# Patient Record
Sex: Male | Born: 2000 | Race: White | Hispanic: No | Marital: Single | Smoking: Never smoker
Health system: Southern US, Community
[De-identification: ages and names within clinical notes are randomized; demographics above are authoritative.]

## PROBLEM LIST (undated history)

## (undated) DIAGNOSIS — F980 Enuresis not due to a substance or known physiological condition: Secondary | ICD-10-CM

## (undated) DIAGNOSIS — Z789 Other specified health status: Secondary | ICD-10-CM

## (undated) HISTORY — PX: TYMPANOSTOMY TUBE PLACEMENT: SHX32

## (undated) HISTORY — DX: Other specified health status: Z78.9

## (undated) HISTORY — DX: Enuresis not due to a substance or known physiological condition: F98.0

---
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
## 2019-04-04 ENCOUNTER — Encounter (HOSPITAL_BASED_OUTPATIENT_CLINIC_OR_DEPARTMENT_OTHER): Payer: Self-pay | Admitting: *Deleted

## 2019-04-04 ENCOUNTER — Emergency Department (HOSPITAL_BASED_OUTPATIENT_CLINIC_OR_DEPARTMENT_OTHER)
Admission: EM | Admit: 2019-04-04 | Discharge: 2019-04-04 | Disposition: A | Discharge status: Home / Self Care | Payer: 59 | Attending: Emergency Medicine | Admitting: Emergency Medicine

## 2019-04-04 ENCOUNTER — Other Ambulatory Visit: Payer: Self-pay

## 2019-04-04 DIAGNOSIS — Y939 Activity, unspecified: Secondary | ICD-10-CM | POA: Diagnosis not present

## 2019-04-04 DIAGNOSIS — S0101XA Laceration without foreign body of scalp, initial encounter: Secondary | ICD-10-CM

## 2019-04-04 DIAGNOSIS — Y999 Unspecified external cause status: Secondary | ICD-10-CM | POA: Insufficient documentation

## 2019-04-04 DIAGNOSIS — W228XXA Striking against or struck by other objects, initial encounter: Secondary | ICD-10-CM | POA: Diagnosis not present

## 2019-04-04 DIAGNOSIS — Y929 Unspecified place or not applicable: Secondary | ICD-10-CM | POA: Insufficient documentation

## 2019-04-04 MED ORDER — LIDOCAINE HCL (PF) 1 % IJ SOLN
5.0000 mL | Freq: Once | INTRAMUSCULAR | Status: AC
  Administered 2019-04-04: 5 mL
  Filled 2019-04-04: qty 5

## 2019-04-04 NOTE — ED Triage Notes (Signed)
Pt c/o head injury with lac x 30 mins ago

## 2019-04-04 NOTE — ED Notes (Signed)
Dressing to head laceration

## 2019-04-04 NOTE — Discharge Instructions (Signed)
Return in 8 days for staple removal 

## 2019-04-05 NOTE — ED Provider Notes (Signed)
[facility] EMERGENCY DEPARTMENT Provider Note   CSN: [id number] Arrival date & time: 04/04/19  1624     History Chief Complaint  Patient presents with  . Head Injury    Carl Myers is a 18 y.o. male.  The history is provided by the patient. No language interpreter was used.  Head Injury Location:  Frontal Mechanism of injury: direct blow   Pain details:    Quality:  Aching   Severity:  Mild   Timing:  Constant   Progression:  Worsening Chronicity:  New Worsened by:  Nothing Ineffective treatments:  None tried Associated symptoms: no loss of consciousness and no memory loss    Pt complains of a cut to his forehead. No loss of conciousness    Past Medical History:  Diagnosis Date  . Nonorganic enuresis     Patient Active Problem List   Diagnosis Date Noted  . Well child check 03/10/2015  . BMI (body mass index), pediatric, 5% to less than 85% for age 17/08/2014  . 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 BMI (body mass index), pediatric, less than 5th percentile for age 20/17/2013  . Enuresis, nocturnal only 10/20/2011    Past Surgical History:  Procedure Laterality Date  . TYMPANOSTOMY TUBE PLACEMENT         Family History  Problem Relation Age of Onset  . Cancer Maternal Grandfather   . Cancer Paternal Grandfather   . Thyroid disease Paternal Grandmother        Aneurysm, renal stones  . Alcohol abuse Neg Hx   . Arthritis Neg Hx   . Asthma Neg Hx   . Birth defects Neg Hx   . COPD Neg Hx   . Depression Neg Hx   . Diabetes Neg Hx   . Drug abuse Neg Hx   . Early death Neg Hx   . Hearing loss Neg Hx   . Heart disease Neg Hx   . Hyperlipidemia Neg Hx   . Hypertension Neg Hx   . Kidney disease Neg Hx   . Learning disabilities Neg Hx   . Mental illness Neg Hx   . Mental retardation Neg Hx   . Miscarriages / Stillbirths Neg Hx   . Stroke Neg Hx   . Vision loss Neg Hx   . Varicose Veins Neg Hx     Social History   Tobacco Use    . Smoking status: Never Smoker  . Smokeless tobacco: Never Used  Substance Use Topics  . Alcohol use: No  . Drug use: No    Home Medications Prior to Admission medications   Not on File    Allergies    Patient has no known allergies.  Review of Systems   Review of Systems  Neurological: Negative for loss of consciousness.  Psychiatric/Behavioral: Negative for memory loss.  All other systems reviewed and are negative.   Physical Exam Updated Vital Signs BP 132/78 (BP Location: Right Arm)   Pulse 80   Temp 98.2 F (36.8 C) (Oral)   Resp 16   Ht 6\' 1"  (1.854 m)   Wt 63.5 kg   SpO2 99%   BMI 18.47 kg/m   Physical Exam Vitals and nursing note reviewed.  Constitutional:      Appearance: He is well-developed.  HENT:     Head: Normocephalic.     Comments: 1.5 cm laceration scalp Pulmonary:     Effort: Pulmonary effort is normal.  Abdominal:     General: There is  no distension.  Musculoskeletal:        General: Normal range of motion.     Cervical back: Normal range of motion.  Skin:    General: Skin is warm.  Neurological:     Mental Status: He is alert and oriented to person, place, and time.     ED Results / Procedures / Treatments   Labs (all labs ordered are listed, but only abnormal results are displayed) Labs Reviewed - No data to display  EKG None  Radiology No results found.  Procedures .Marland KitchenLaceration Repair  Date/Time: 04/05/2019 8:41 AM Performed by: Elson Areas, PA-C Authorized by: Elson Areas, PA-C   Consent:    Consent obtained:  Verbal   Consent given by:  Patient   Risks discussed:  Infection   Alternatives discussed:  No treatment Anesthesia (see MAR for exact dosages):    Anesthesia method:  Local infiltration Laceration details:    Location:  Scalp   Length (cm):  1.5   Depth (mm):  3 Repair type:    Repair type:  Simple Pre-procedure details:    Preparation:  Patient was prepped and draped in usual sterile  fashion Exploration:    Contaminated: no   Treatment:    Area cleansed with:  Betadine   Amount of cleaning:  Standard Skin repair:    Repair method:  Staples   Number of staples:  2 Post-procedure details:    Patient tolerance of procedure:  Tolerated with difficulty   (including critical care time)  Medications Ordered in ED Medications  lidocaine (PF) (XYLOCAINE) 1 % injection 5 mL (5 mLs Infiltration Given by Other 04/04/19 1926)    ED Course  I have reviewed the triage vital signs and the nursing notes.  Pertinent labs & imaging results that were available during my care of the patient were reviewed by me and considered in my medical decision making (see chart for details).    MDM Rules/Calculators/A&P                      MDM  Pt counseled on staples.  Staple removal in 8 days Final Clinical Impression(s) / ED Diagnoses Final diagnoses:  Laceration of scalp without foreign body, initial encounter    Rx / DC Orders ED Discharge Orders    None      Discharge Instructions     Return in 8 days for staple removal       Elson Areas, [place] 04/05/19 [id number]    Rolan Bucco, MD 04/05/19 1504

## 2019-04-12 ENCOUNTER — Ambulatory Visit (INDEPENDENT_AMBULATORY_CARE_PROVIDER_SITE_OTHER): Payer: 59 | Admitting: Pediatrics

## 2019-04-12 ENCOUNTER — Other Ambulatory Visit: Payer: Self-pay

## 2019-04-12 ENCOUNTER — Encounter: Payer: Self-pay | Admitting: Pediatrics

## 2019-04-12 VITALS — Wt 137.3 lb

## 2019-04-12 DIAGNOSIS — Z4802 Encounter for removal of sutures: Secondary | ICD-10-CM

## 2019-04-12 NOTE — Progress Notes (Signed)
Subjective:    Carl Myers is a 19 y.o. male who obtained a laceration 7 days ago, which required closure with 2 staples. Mechanism of injury: hit head on wooden board. He denies pain, redness, or drainage from the wound. His last tetanus was 8 years ago.  The following portions of the patient's history were reviewed and updated as appropriate: allergies, current medications, past family history, past medical history, past social history, past surgical history and problem list.  Review of Systems Pertinent items are noted in HPI.    Objective:    Wt 137 lb 4.8 oz (62.3 kg)   BMI 18.11 kg/m  Injury exam:  A 1.5 cm laceration noted on the scalp is healing well, without evidence of infection.    Assessment:    Laceration is healing well, without evidence of infection.    Plan:     1. 2 staples were removed. 2. Wound care discussed. 3. Follow up as needed.

## 2020-06-10 ENCOUNTER — Telehealth: Payer: Self-pay

## 2020-06-10 DIAGNOSIS — K5904 Chronic idiopathic constipation: Secondary | ICD-10-CM

## 2020-06-10 NOTE — Telephone Encounter (Signed)
Spoke to mom and ordered abdominal X ray for possible constipation

## 2020-06-10 NOTE — Telephone Encounter (Signed)
Called to schedule last WCC (per Crystal & Dr. Ardyth Man). After looking at a few dates she decided to check with her son first to make sure he will be available (will call back later to schedule). She then asked to speak with Dr. Ardyth Man about a concern she has regarding Laderius's stomach problems. She was reluctant to give much information over the phone, but eventually said that it has something to do with discomfort & pain. Asked for a call back at some point today.  [phone]

## 2020-06-11 ENCOUNTER — Other Ambulatory Visit: Payer: Self-pay

## 2020-06-11 ENCOUNTER — Ambulatory Visit
Admission: RE | Admit: 2020-06-11 | Discharge: 2020-06-11 | Disposition: A | Discharge status: Home / Self Care | Payer: 59 | Source: Ambulatory Visit | Attending: Pediatrics | Admitting: Pediatrics

## 2020-06-11 DIAGNOSIS — K5904 Chronic idiopathic constipation: Secondary | ICD-10-CM

## 2020-06-12 ENCOUNTER — Telehealth: Payer: Self-pay

## 2020-06-12 NOTE — Telephone Encounter (Signed)
Mother would like results of X Ray done yesterday

## 2020-06-13 NOTE — Telephone Encounter (Signed)
Discussed results with dad --mom was not able to talk

## 2021-03-13 ENCOUNTER — Encounter: Payer: Self-pay | Admitting: Gastroenterology

## 2021-04-17 ENCOUNTER — Encounter: Payer: Self-pay | Admitting: Gastroenterology

## 2021-04-17 ENCOUNTER — Other Ambulatory Visit: Payer: Self-pay

## 2021-04-17 ENCOUNTER — Ambulatory Visit (INDEPENDENT_AMBULATORY_CARE_PROVIDER_SITE_OTHER): Payer: 59 | Admitting: Gastroenterology

## 2021-04-17 VITALS — BP 118/78 | HR 80 | Ht 73.0 in | Wt 141.4 lb

## 2021-04-17 DIAGNOSIS — K581 Irritable bowel syndrome with constipation: Secondary | ICD-10-CM | POA: Diagnosis not present

## 2021-04-17 NOTE — Patient Instructions (Addendum)
If you are age 22 or older, your body mass index should be between 23-30. Your Body mass index is 18.65 kg/m. If this is out of the aforementioned range listed, please consider follow up with your Primary Care Provider.  If you are age 49 or younger, your body mass index should be between 19-25. Your Body mass index is 18.65 kg/m. If this is out of the aformentioned range listed, please consider follow up with your Primary Care Provider.   ________________________________________________________  The [facility] providers would like to encourage you to use [facility] to communicate with providers for non-urgent requests or questions.  Due to long hold times on the telephone, sending your provider a message by [facility] may be a faster and more efficient way to get a response.  Please allow 48 business hours for a response.  Please remember that this is for non-urgent requests.  _______________________________________________________  Increase water intake.   Call with any questions or concerns.  Thank you,  Dr. Jackquline Denmark

## 2021-04-17 NOTE — Progress Notes (Signed)
Chief Complaint: For constipation  Referring Provider:  Marcha Solders, MD      ASSESSMENT AND PLAN;   #1. IBS-C-doing much better with dietary changes  Plan: -Increase water intake -Continue Apple/prune juice/oranges -No need for meds or any GI intervention currently.  No red flag symptoms -He will call us if he starts having any problems in future.  HPI:    Carl Myers is a 21 y.o. male  Known to me C/O constipation with pellet-like stools, abdominal bloating x intermittently ever since early teens.  Also had an episode as a child.  Got worse in March 2022-x-ray KUB showed moderate stool throughout the colon.  He was advised to start prune juice, fruits like apples, oranges and increased water intake with resultant significant improvement.  Is currently having 1 BM every day.  Abdominal distention is much better.  No abdominal pain.  No diarrhea.  No upper GI symptoms.  No weight loss.  No melena or hematochezia.  Denies having any history suggestive of gluten intolerance  No excessive sodas, chocolates, chewing gums, artificial sweeteners and candy. No NSAIDs  No family history of colon cancer.    Past Medical History:  Diagnosis Date   Nonorganic enuresis     Past Surgical History:  Procedure Laterality Date   TYMPANOSTOMY TUBE PLACEMENT      Family History  Problem Relation Age of Onset   Cancer Maternal Grandfather    Cancer Paternal Grandfather    Thyroid disease Paternal Grandmother        Aneurysm, renal stones   Alcohol abuse Neg Hx    Arthritis Neg Hx    Asthma Neg Hx    Birth defects Neg Hx    COPD Neg Hx    Depression Neg Hx    Diabetes Neg Hx    Drug abuse Neg Hx    Early death Neg Hx    Hearing loss Neg Hx    Heart disease Neg Hx    Hyperlipidemia Neg Hx    Hypertension Neg Hx    Kidney disease Neg Hx    Learning disabilities Neg Hx    Mental illness Neg Hx    Mental retardation Neg Hx    Miscarriages / Stillbirths Neg  Hx    Stroke Neg Hx    Vision loss Neg Hx    Varicose Veins Neg Hx     Social History   Tobacco Use   Smoking status: Never   Smokeless tobacco: Never  Vaping Use   Vaping Use: Never used  Substance Use Topics   Alcohol use: No   Drug use: No    No current outpatient medications on file.   No current facility-administered medications for this visit.    No Known Allergies  Review of Systems:  Constitutional: Denies fever, chills, diaphoresis, appetite change and fatigue.  HEENT: Denies photophobia, eye pain, redness, hearing loss, ear pain, congestion, sore throat, rhinorrhea, sneezing, mouth sores, neck pain, neck stiffness and tinnitus.   Respiratory: Denies SOB, DOE, cough, chest tightness,  and wheezing.   Cardiovascular: Denies chest pain, palpitations and leg swelling.  Genitourinary: Denies dysuria, urgency, frequency, hematuria, flank pain and difficulty urinating.  Musculoskeletal: Denies myalgias, back pain, joint swelling, arthralgias and gait problem.  Skin: No rash.  Neurological: Denies dizziness, seizures, syncope, weakness, light-headedness, numbness and headaches.  Hematological: Denies adenopathy. Easy bruising, personal or family bleeding history  Psychiatric/Behavioral: No anxiety or depression     Physical Exam:  BP 118/78    Pulse 80    Ht 6\' 1"  (1.854 m)    Wt 141 lb 6 oz (64.1 kg)    SpO2 99%    BMI 18.65 kg/m  Wt Readings from Last 3 Encounters:  04/17/21 141 lb 6 oz (64.1 kg)  04/12/19 137 lb 4.8 oz (62.3 kg) (22 %, Z= -0.76)*  04/04/19 140 lb (63.5 kg) (27 %, Z= -0.63)*   * Growth percentiles are based on CDC (Boys, 2-20 Years) data.   Constitutional:  Well-developed, in no acute distress. Psychiatric: Normal mood and affect. Behavior is normal. HEENT: Conjunctivae are normal. No scleral icterus. Cardiovascular: Normal rate, regular rhythm. No edema Pulmonary/chest: Effort normal and breath sounds normal. No wheezing, rales or  rhonchi. Abdominal: Soft, nondistended. Nontender. Bowel sounds active throughout. There are no masses palpable. No hepatomegaly. Rectal: Deferred Neurological: Alert and oriented to person place and time. Skin: Skin is warm and dry. No rashes noted.    Carmell Austria, MD 04/17/2021, 11:17 AM  Cc: Marcha Solders, MD

## 2022-07-22 IMAGING — CR DG ABDOMEN 1V
1 series · 1 of 1 positions shown · non-contrast
Comparison: None.

CLINICAL DATA: Constipation.  Epigastric pain for 1.5 months.

EXAM:
ABDOMEN - 1 VIEW

[t abdomen supine]
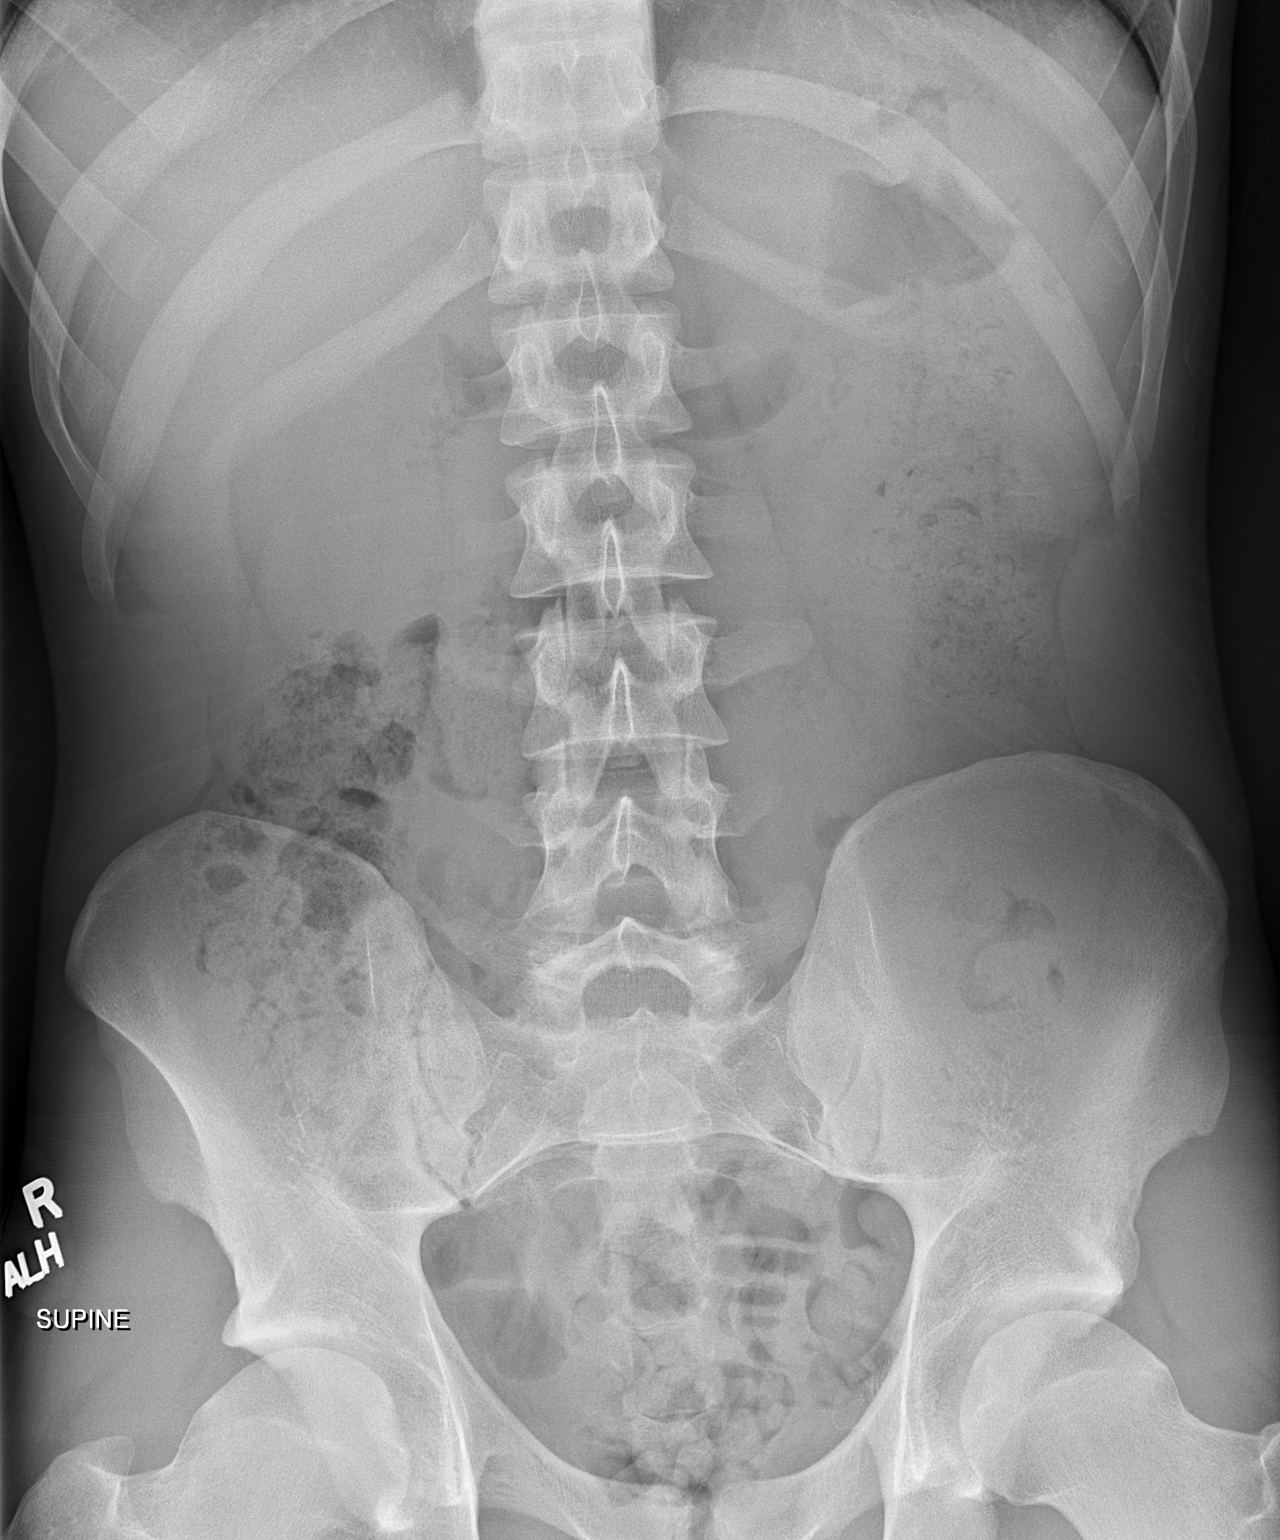

[1 of 1 positions shown; findings below may reference images not displayed]

FINDINGS: There is a moderate amount of stool throughout the colon and rectum.
No dilated loops of bowel are seen to suggest obstruction. No
abnormal calcification is evident. No acute osseous abnormality is
seen.
IMPRESSION: Moderate colonic stool burden.

## 2023-10-18 ENCOUNTER — Ambulatory Visit: Payer: Self-pay | Admitting: Physician Assistant

## 2023-10-18 ENCOUNTER — Encounter: Payer: Self-pay | Admitting: Physician Assistant

## 2023-10-18 ENCOUNTER — Ambulatory Visit: Payer: Self-pay

## 2023-10-18 VITALS — BP 112/73 | HR 85 | Ht 74.0 in | Wt 142.0 lb

## 2023-10-18 DIAGNOSIS — K625 Hemorrhage of anus and rectum: Secondary | ICD-10-CM

## 2023-10-18 NOTE — Telephone Encounter (Signed)
 FYI Only or Action Required?: FYI only for provider.  Patient was last seen in primary care on n/a.  Called Nurse Triage reporting Rectal Bleeding.  Symptoms began today.  Interventions attempted: Nothing.  Symptoms are: stable.  Triage Disposition: See PCP When Office is Open (Within 3 Days)  Patient/caregiver understands and will follow disposition?: to [employer] today. NP appt made for Dr Frann in August     Copied from CRM #[id number]. Topic: Clinical - Red Word Triage >> Oct 18, 2023  3:43 PM Rosina D wrote: Reason for RMF:[id number] when he has a bowel movement and the bleeding is in his shorts. It just started today Reason for Disposition  MILD rectal bleeding (e.g., more than just a few drops or streaks)  Answer Assessment - Initial Assessment Questions 1. APPEARANCE of BLOOD: What color is it? Is it passed separately, on the surface of the stool, or mixed in with the stool?      Dark red separate 2. AMOUNT: How much blood was passed?      Wiped - 3. FREQUENCY: How many times has blood been passed with the stools?      Woke up and on underwear and then went to bathroom this am  4. ONSET: When was the blood first seen in the stools? (Days or weeks)      This am  5. DIARRHEA: Is there also some diarrhea? If Yes, ask: How many diarrhea stools in the past 24 hours?      None did have diarrhea while in [facility] and Sunday  6. CONSTIPATION: Do you have constipation? If Yes, ask: How bad is it?     no 7. RECURRENT SYMPTOMS: Have you had blood in your stools before? If Yes, ask: When was the last time? and What happened that time?      no 8. BLOOD THINNERS: Do you take any blood thinners? (e.g., aspirin, clopidogrel / Plavix, coumadin, heparin). Notes: Other strong blood thinners include: Arixtra (fondaparinux), Eliquis (apixaban), Pradaxa (dabigatran), and Xarelto (rivaroxaban).     no 9. OTHER SYMPTOMS: Do you have any other symptoms?  (e.g.,  abdomen pain, vomiting, dizziness, fever)     no  Protocols used: Rectal Bleeding-A-AH

## 2023-10-18 NOTE — Patient Instructions (Signed)
 VISIT SUMMARY:  You came in today because you noticed rectal bleeding. You observed blood on the tissue after a bowel movement and experienced slight discomfort in the rectal area. You recently had diarrhea after a trip to [facility], but your bowel movements have since returned to normal. You have a history of irritable bowel syndrome, which has improved over time.  YOUR PLAN:  -RECTAL BLEEDING: Rectal bleeding can be caused by irritation from recent diarrhea or internal hemorrhoids. To help manage this, increase your hydration, use hemorrhoidal suppositories for 1-2 days, and use baby wipes for gentle cleaning. Monitor for any recurrence of symptoms. If the bleeding continues, we may need to do stool studies or refer you to a gastroenterologist.  Rectal Bleeding  Rectal bleeding is when blood passes out of the opening of the butt (anus). If you have rectal bleeding, you may see bright red blood in your underwear or in the toilet after you poop. You may also have blood mixed with your poop (stool) or dark red or black poop. Rectal bleeding is often a sign that something is wrong. Causes of rectal bleeding include: Diverticulosis. This is when sacs or pockets form in the large intestine (colon). Hemorrhoids. These are blood vessels around the anus or inside the rectum that are larger than normal. Anal fissures. This is a tear in the anus. Proctitis and colitis. These are conditions that happen when the rectum, colon, or anus becomes inflamed. Polyps. These are growths. They may be cancer (malignant) or not cancer (benign). Infections of the intestines. Fistulas. These are abnormal openings in the rectum and anus. Rectal prolapse. This is when a part of the rectum sticks out from the anus. Follow these instructions at home: Medicines Take over-the-counter and prescription medicines only as told by your health care provider. Ask your provider about changing or stopping your regular medicines.  These include any diabetes medicine or blood thinners you take. Medicines that thin the blood can make rectal bleeding worse. Managing constipation Your condition may cause constipation. To prevent or treat constipation, or to help make your poop soft, you may need to: Drink enough fluid to keep your pee (urine) pale yellow. Take over-the-counter or prescription medicines. Eat foods that are high in fiber, such as beans, whole grains, and fresh fruits and vegetables. Ask your provider if you need a fiber supplement. Limit foods that are high in fat and processed sugars, such as fried or sweet foods.  General instructions Try not to strain when you poop. Take a warm bath. This may help to soothe pain in your rectum. Watch for any changes in your symptoms. Contact a health care provider if: You have pain or tenderness in your abdomen. You have a fever. You feel weak or nauseous. You cannot poop. You have new or more rectal bleeding. You have black or dark red poop. You vomit, and the vomit has blood or something that looks like coffee grounds in it. Get help right away if: You faint. You have severe pain in your rectum. These symptoms may be an emergency. Get help right away. Call 911. Do not wait to see if the symptoms will go away. Do not drive yourself to the hospital. This information is not intended to replace advice given to you by your health care provider. Make sure you discuss any questions you have with your health care provider. Document Revised: 11/10/2021 Document Reviewed: 11/10/2021 Elsevier Patient Education  2024 [employer].

## 2023-10-18 NOTE — Progress Notes (Signed)
 New Patient Office Visit  Subjective    Patient ID: Carl Myers, male    DOB: 03-09-2001  Age: 23 y.o. MRN: [id number]  CC:  Chief Complaint  Patient presents with   Hemorrhoids    Denies any constipation, feels it could be related to toilet tissue    Discussed the use of AI scribe software for clinical note transcription with the patient, who gave verbal consent to proceed.  History of Present Illness  Carl Myers is a 23 year old male who presents with rectal bleeding.  He noticed rectal bleeding this morning while getting into the shower and observed blood on the tissue after a bowel movement at work. The blood is darker in color. He experiences slight discomfort in the rectal area but no significant pain, and it does not prevent him from sitting. There is no constipation or straining during bowel movements.  He recently returned from a trip to [facility], where he experienced diarrhea, which he attributes to dietary changes, alcohol use and environmental factors. His bowel movement this morning was normal. He has a past medical history of irritable bowel syndrome, which has improved over time, allowing regular daily bowel movements without medication. He uses coffee and nicotine pouches to help regulate his bowel movements.  He denies any abdominal pain and does not use NSAIDs. He drinks a decent amount of water daily, filling his water bottle three to four times a day. No significant abdominal pain, constipation, or straining. No use of medications that could contribute to gastrointestinal bleeding.    No outpatient encounter medications on file as of 10/18/2023.   No facility-administered encounter medications on file as of 10/18/2023.    Past Medical History:  Diagnosis Date   Nonorganic enuresis     Past Surgical History:  Procedure Laterality Date   TYMPANOSTOMY TUBE PLACEMENT      Family History  Problem Relation Age of Onset   Cancer Maternal Grandfather     Cancer Paternal Grandfather    Thyroid disease Paternal Grandmother        Aneurysm, renal stones   Alcohol abuse Neg Hx    Arthritis Neg Hx    Asthma Neg Hx    Birth defects Neg Hx    COPD Neg Hx    Depression Neg Hx    Diabetes Neg Hx    Drug abuse Neg Hx    Early death Neg Hx    Hearing loss Neg Hx    Heart disease Neg Hx    Hyperlipidemia Neg Hx    Hypertension Neg Hx    Kidney disease Neg Hx    Learning disabilities Neg Hx    Mental illness Neg Hx    Mental retardation Neg Hx    Miscarriages / Stillbirths Neg Hx    Stroke Neg Hx    Vision loss Neg Hx    Varicose Veins Neg Hx     Social History   Socioeconomic History   Marital status: Single    Spouse name: Not on file   Number of children: Not on file   Years of education: Not on file   Highest education level: Not on file  Occupational History   Occupation: Customer Service  Tobacco Use   Smoking status: Never   Smokeless tobacco: Never  Vaping Use   Vaping status: Never Used  Substance and Sexual Activity   Alcohol use: No   Drug use: No   Sexual activity: Not Currently  Other Topics Concern  Not on file  Social History Narrative   Not on file   Social Drivers of Health   Financial Resource Strain: Not on file  Food Insecurity: Not on file  Transportation Needs: Not on file  Physical Activity: Not on file  Stress: Not on file  Social Connections: Not on file  Intimate Partner Violence: Not on file    Review of Systems  Constitutional:  Negative for chills and fever.  HENT: Negative.    Eyes: Negative.   Respiratory:  Negative for shortness of breath.   Cardiovascular:  Negative for chest pain.  Gastrointestinal:  Negative for abdominal pain, constipation, nausea and vomiting.  Genitourinary:  Negative for dysuria and hematuria.  Musculoskeletal:  Negative for back pain.  Skin: Negative.   Neurological: Negative.   Endo/Heme/Allergies: Negative.   Psychiatric/Behavioral: Negative.           Objective    BP 112/73 (BP Location: Left Arm, Patient Position: Sitting, Cuff Size: Large)   Pulse 85   Ht 6' 2 (1.88 m)   Wt 142 lb (64.4 kg)   BMI 18.23 kg/m   Physical Exam Vitals and nursing note reviewed.  Constitutional:      Appearance: Normal appearance.  HENT:     Head: Normocephalic and atraumatic.     Right Ear: External ear normal.     Left Ear: External ear normal.     Nose: Nose normal.     Mouth/Throat:     Mouth: Mucous membranes are moist.     Pharynx: Oropharynx is clear.  Eyes:     Extraocular Movements: Extraocular movements intact.     Conjunctiva/sclera: Conjunctivae normal.     Pupils: Pupils are equal, round, and reactive to light.  Cardiovascular:     Rate and Rhythm: Normal rate and regular rhythm.     Pulses: Normal pulses.     Heart sounds: Normal heart sounds.  Pulmonary:     Effort: Pulmonary effort is normal.     Breath sounds: Normal breath sounds.  Musculoskeletal:        General: Normal range of motion.     Cervical back: Normal range of motion.  Skin:    General: Skin is warm and dry.  Neurological:     General: No focal deficit present.     Mental Status: He is alert and oriented to person, place, and time.  Psychiatric:        Mood and Affect: Mood normal.        Behavior: Behavior normal.        Thought Content: Thought content normal.        Judgment: Judgment normal.       Assessment & Plan:   Problem List Items Addressed This Visit   None Visit Diagnoses       Rectal bleeding    -  Primary       Assessment and Plan Rectal Bleeding Acute rectal bleeding likely due to irritation from recent diarrhea or internal hemorrhoids. No severe symptoms requiring immediate intervention. - Continue proper hydration. - Use hemorrhoidal suppositories OTC - Use baby wipes for gentle cleaning. - Monitor for recurrence; consider stool studies or gastroenterology referral if symptoms persist.  Red flags given for  prompt reevaluation    I have reviewed the patient's medical history (PMH, PSH, Social History, Family History, Medications, and allergies) , and have been updated if relevant. I spent 20 minutes reviewing chart and  face to face time with patient.  Return if symptoms worsen or fail to improve.   Kirk RAMAN Mayers, PA-C

## 2023-11-22 ENCOUNTER — Ambulatory Visit (INDEPENDENT_AMBULATORY_CARE_PROVIDER_SITE_OTHER): Admitting: Family Medicine

## 2023-11-22 ENCOUNTER — Encounter: Payer: Self-pay | Admitting: Family Medicine

## 2023-11-22 VITALS — BP 110/70 | HR 63 | Temp 98.0°F | Resp 16 | Ht 74.0 in | Wt 153.6 lb

## 2023-11-22 DIAGNOSIS — Z1159 Encounter for screening for other viral diseases: Secondary | ICD-10-CM | POA: Diagnosis not present

## 2023-11-22 DIAGNOSIS — Z114 Encounter for screening for human immunodeficiency virus [HIV]: Secondary | ICD-10-CM

## 2023-11-22 DIAGNOSIS — Z0000 Encounter for general adult medical examination without abnormal findings: Secondary | ICD-10-CM | POA: Diagnosis not present

## 2023-11-22 NOTE — Patient Instructions (Signed)
Give us 2-3 business days to get the results of your labs back.   Keep the diet clean and stay active.  Do monthly self testicular checks in the shower. You are feeling for lumps/bumps that don't belong. If you feel anything like this, let me know!  I recommend getting the flu shot in mid October. This suggestion would change if the CDC comes out with a different recommendation.   Please get me a copy of your advanced directive form at your convenience.   Let us know if you need anything.  

## 2023-11-22 NOTE — Progress Notes (Signed)
 Chief Complaint  Patient presents with   Establish Care    Well Male Carl Myers is here for a complete physical.   His last physical was >1 year ago.  Current diet: in general, a healthy diet.   Current exercise: golfing, tennis, pickle ball Weight trend: stable Fatigue out of ordinary? No. Seat belt? Yes.   Advanced directive? No  Health maintenance Tetanus- No HIV- No Hep C- No  Past Medical History:  Diagnosis Date   No known health problems      Past Surgical History:  Procedure Laterality Date   TYMPANOSTOMY TUBE PLACEMENT      Medications  Takes no meds routinely.   Allergies No Known Allergies  Family History Family History  Problem Relation Age of Onset   Cancer Maternal Grandfather    Cancer Paternal Grandfather    Thyroid disease Paternal Grandmother        Aneurysm, renal stones   Alcohol abuse Neg Hx    Arthritis Neg Hx    Asthma Neg Hx    Birth defects Neg Hx    COPD Neg Hx    Depression Neg Hx    Diabetes Neg Hx    Drug abuse Neg Hx    Early death Neg Hx    Hearing loss Neg Hx    Heart disease Neg Hx    Hyperlipidemia Neg Hx    Hypertension Neg Hx    Kidney disease Neg Hx    Learning disabilities Neg Hx    Mental illness Neg Hx    Mental retardation Neg Hx    Miscarriages / Stillbirths Neg Hx    Stroke Neg Hx    Vision loss Neg Hx    Varicose Veins Neg Hx     Review of Systems: Constitutional: no fevers or chills Eye:  no recent significant change in vision Ear/Nose/Mouth/Throat:  Ears:  no hearing loss Nose/Mouth/Throat:  no complaints of nasal congestion, no sore throat Cardiovascular:  no chest pain Respiratory:  no shortness of breath Gastrointestinal:  no abdominal pain, no change in bowel habits GU:  Male: negative for dysuria Musculoskeletal/Extremities:  no pain of the joints Integumentary (Skin/Breast):  no abnormal skin lesions reported Neurologic:  no headaches Endocrine: No unexpected weight  changes Hematologic/Lymphatic:  no night sweats  Exam BP 110/70 (BP Location: Left Arm, Patient Position: Sitting)   Pulse 63   Temp 98 F (36.7 C) (Oral)   Resp 16   Ht 6' 2 (1.88 m)   Wt 153 lb 9.6 oz (69.7 kg)   SpO2 99%   BMI 19.72 kg/m  General:  well developed, well nourished, in no apparent distress Skin:  no significant moles, warts, or growths Head:  no masses, lesions, or tenderness Eyes:  pupils equal and round, sclera anicteric without injection Ears:  canals without lesions, TMs shiny without retraction, no obvious effusion, no erythema Nose:  nares patent, mucosa normal Throat/Pharynx:  lips and gingiva without lesion; tongue and uvula midline; non-inflamed pharynx; no exudates or postnasal drainage Neck: neck supple without adenopathy, thyromegaly, or masses Lungs:  clear to auscultation, breath sounds equal bilaterally, no respiratory distress Cardio:  regular rate and rhythm, no bruits, no LE edema Abdomen:  abdomen soft, nontender; bowel sounds normal; no masses or organomegaly Genital (male): Deferred Rectal: Deferred Musculoskeletal:  symmetrical muscle groups noted without atrophy or deformity Extremities:  no clubbing, cyanosis, or edema, no deformities, no skin discoloration Neuro:  gait normal; deep tendon reflexes normal and symmetric Psych:  well oriented with normal range of affect and appropriate judgment/insight  Assessment and Plan  Well adult exam - Plan: CBC, Comprehensive metabolic panel with GFR, Lipid panel  Encounter for hepatitis C screening test for low risk patient - Plan: Hepatitis C antibody  Screening for HIV without presence of risk factors - Plan: HIV Antibody (routine testing w rflx)   Well 23 y.o. male. Counseled on diet and exercise. Advanced directive form provided today.  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  Self testicular exams recommended at least monthly.  He will come back for both labs and his Tdap.  Other  orders as above. Follow up in 1 year pending the above workup. The patient voiced understanding and agreement to the plan.  Mabel [place], DO 11/22/23 4:09 PM

## 2023-11-29 ENCOUNTER — Ambulatory Visit: Payer: Self-pay | Admitting: Family Medicine

## 2023-11-29 ENCOUNTER — Other Ambulatory Visit (INDEPENDENT_AMBULATORY_CARE_PROVIDER_SITE_OTHER)

## 2023-11-29 DIAGNOSIS — Z1159 Encounter for screening for other viral diseases: Secondary | ICD-10-CM

## 2023-11-29 DIAGNOSIS — Z0000 Encounter for general adult medical examination without abnormal findings: Secondary | ICD-10-CM | POA: Diagnosis not present

## 2023-11-29 DIAGNOSIS — Z114 Encounter for screening for human immunodeficiency virus [HIV]: Secondary | ICD-10-CM

## 2023-11-29 LAB — LIPID PANEL
Cholesterol: 170 mg/dL (ref 0–200)
HDL: 75 mg/dL (ref >39.00)
LDL Cholesterol: 82 mg/dL (ref 0–99)
NonHDL: 95.07
Total CHOL/HDL Ratio: 2
Triglycerides: 66 mg/dL (ref 0.0–149.0)
VLDL: 13.2 mg/dL (ref 0.0–40.0)

## 2023-11-29 LAB — CBC
HCT: 46.1 % (ref 39.0–52.0)
Hemoglobin: 15.9 g/dL (ref 13.0–17.0)
MCHC: 34.4 g/dL (ref 30.0–36.0)
MCV: 88.5 fl (ref 78.0–100.0)
Platelets: 226 K/uL (ref 150.0–400.0)
RBC: 5.21 Mil/uL (ref 4.22–5.81)
RDW: 13.4 % (ref 11.5–15.5)
WBC: 6.8 K/uL (ref 4.0–10.5)

## 2023-11-29 LAB — COMPREHENSIVE METABOLIC PANEL WITH GFR
ALT: 21 U/L (ref 0–53)
AST: 20 U/L (ref 0–37)
Albumin: 4.8 g/dL (ref 3.5–5.2)
Alkaline Phosphatase: 51 U/L (ref 39–117)
BUN: 19 mg/dL (ref 6–23)
CO2: 30 meq/L (ref 19–32)
Calcium: 9.2 mg/dL (ref 8.4–10.5)
Chloride: 100 meq/L (ref 96–112)
Creatinine, Ser: 1.16 mg/dL (ref 0.40–1.50)
GFR: 88.4 mL/min (ref >60.00)
Glucose, Bld: 98 mg/dL (ref 70–99)
Potassium: 4.2 meq/L (ref 3.5–5.1)
Sodium: 139 meq/L (ref 135–145)
Total Bilirubin: 1.2 mg/dL (ref 0.2–1.2)
Total Protein: 6.9 g/dL (ref 6.0–8.3)

## 2023-11-30 LAB — HIV ANTIBODY (ROUTINE TESTING W REFLEX): HIV 1&2 Ab, 4th Generation: NONREACTIVE

## 2023-11-30 LAB — HEPATITIS C ANTIBODY: Hepatitis C Ab: NONREACTIVE
